# Patient Record
Sex: Female | Born: 1980
Health system: Southern US, Community
[De-identification: ages and names within clinical notes are randomized; demographics above are authoritative.]

---

## 2020-02-05 ENCOUNTER — Encounter (HOSPITAL_COMMUNITY): Payer: Self-pay | Admitting: Emergency Medicine

## 2020-02-05 ENCOUNTER — Emergency Department (HOSPITAL_COMMUNITY)
Admission: EM | Admit: 2020-02-05 | Discharge: 2020-02-06 | Disposition: A | Discharge status: Home / Self Care | Payer: Self-pay | Attending: Emergency Medicine | Admitting: Emergency Medicine

## 2020-02-05 ENCOUNTER — Emergency Department (HOSPITAL_COMMUNITY): Payer: Self-pay

## 2020-02-05 DIAGNOSIS — M79602 Pain in left arm: Secondary | ICD-10-CM | POA: Insufficient documentation

## 2020-02-05 DIAGNOSIS — M542 Cervicalgia: Secondary | ICD-10-CM | POA: Insufficient documentation

## 2020-02-05 DIAGNOSIS — W19XXXA Unspecified fall, initial encounter: Secondary | ICD-10-CM

## 2020-02-05 DIAGNOSIS — M549 Dorsalgia, unspecified: Secondary | ICD-10-CM | POA: Insufficient documentation

## 2020-02-05 DIAGNOSIS — W010XXA Fall on same level from slipping, tripping and stumbling without subsequent striking against object, initial encounter: Secondary | ICD-10-CM | POA: Insufficient documentation

## 2020-02-05 DIAGNOSIS — Y92511 Restaurant or cafe as the place of occurrence of the external cause: Secondary | ICD-10-CM | POA: Insufficient documentation

## 2020-02-05 DIAGNOSIS — M25552 Pain in left hip: Secondary | ICD-10-CM | POA: Insufficient documentation

## 2020-02-05 MED ORDER — IBUPROFEN 400 MG PO TABS: 600.0000 mg | ORAL_TABLET | Freq: Once | ORAL | Status: DC

## 2020-02-05 NOTE — ED Notes (Signed)
Patient transported to CT 

## 2020-02-05 NOTE — ED Triage Notes (Signed)
Pt arrives via [facility] after slipping and falling at work at ground level. EMS states that pt reported a burning pain in her lower lumbar spine and pain in buttocks. EMS noted left-sided deficits during transportation - weaker strength in L hand and weaker push against pressure in L foot. Pt refused peripheral IV and medications during transportation to hospital.

## 2020-02-05 NOTE — ED Notes (Signed)
Patient transported to X-ray 

## 2020-02-05 NOTE — Discharge Instructions (Signed)
You have been seen today for a few areas of injury. There were no acute abnormalities on the x-rays, including no sign of fracture or dislocation, however, there could be injuries to the soft tissues, such as the ligaments or tendons that are not seen on xrays. There could also be what are called occult fractures that are small fractures not seen on xray. Antiinflammatory medications: Take 600 mg of ibuprofen every 6 hours or 440 mg (over the counter dose) to 500 mg (prescription dose) of naproxen every 12 hours for the next 3 days. After this time, these medications may be used as needed for pain. Take these medications with food to avoid upset stomach. Choose only one of these medications, do not take them together. Acetaminophen (generic for Tylenol): Should you continue to have additional pain while taking the ibuprofen or naproxen, you may add in acetaminophen as needed. Your daily total maximum amount of acetaminophen from all sources should be limited to 4000mg /day for persons without liver problems, or 2000mg /day for those with liver problems. Ice: May apply ice to the area over the next 24 hours for 15 minutes at a time to reduce swelling. Elevation: Keep the extremity elevated as often as possible to reduce pain and inflammation. Follow up: If symptoms are improving, you may follow up with your primary care provider for any continued management. If symptoms are not starting to improve within a week, you should follow up with the orthopedic specialist within two weeks. Return: Return to the ED for numbness, weakness, increasing pain, overall worsening symptoms, loss of function, or if symptoms are not improving, you have tried to follow up with the orthopedic specialist, and have been unable to do so.  For prescription assistance, may try using prescription discount sites or apps, such as goodrx.com or Good Rx smart phone app.

## 2020-02-05 NOTE — ED Provider Notes (Signed)
[facility] EMERGENCY DEPARTMENT Provider Note   CSN: [id number] Arrival date & time: 02/05/20  2159     History No chief complaint on file.   Heather Travis is a 39 y.o. female.  HPI      Heather Travis is a 39 y.o. female, patient with no pertinent past medical history, presenting to the ED for evaluation following a fall that occurred shortly prior to arrival. Patient states she was at a restaurant, slipped on some water, and landed on her left side. She complains of pain in the left posterior hip, back, left arm pain, and neck. Denies anticoagulation.  Denies syncope, LOC, nausea/vomiting, numbness, weakness, chest pain, shortness of breath, abdominal pain, head injury, or any other complaints.  History reviewed. No pertinent past medical history.  There are no problems to display for this patient.   History reviewed. No pertinent surgical history.   OB History   No obstetric history on file.     History reviewed. No pertinent family history.  Social History   Tobacco Use  . Smoking status: Not on file  Substance Use Topics  . Alcohol use: Not on file  . Drug use: Not on file    Home Medications Prior to Admission medications   Not on File    Allergies    Patient has no known allergies.  Review of Systems   Review of Systems  Constitutional: Negative for diaphoresis.  Respiratory: Negative for shortness of breath.   Cardiovascular: Negative for chest pain.  Gastrointestinal: Negative for abdominal pain, nausea and vomiting.  Musculoskeletal: Positive for arthralgias, back pain and neck pain.  Neurological: Negative for dizziness, syncope, weakness, numbness and headaches.  All other systems reviewed and are negative.   Physical Exam Updated Vital Signs BP 119/71   Pulse 89   Resp 14   Ht 5\' 4"  (1.626 m)   Wt 72.6 kg   SpO2 100%   BMI 27.46 kg/m   Physical Exam Vitals and nursing note reviewed.  Constitutional:       General: She is not in acute distress.    Appearance: She is well-developed. She is not diaphoretic.     Interventions: Cervical collar in place.  HENT:     Head: Normocephalic and atraumatic.     Mouth/Throat:     Mouth: Mucous membranes are moist.     Pharynx: Oropharynx is clear.  Eyes:     Conjunctiva/sclera: Conjunctivae normal.  Neck:     Comments: After clear cervical spine CT, c-collar was removed.  Patient has full range of motion in the neck without noted difficulty.  No persistent pain. Cardiovascular:     Rate and Rhythm: Normal rate and regular rhythm.     Pulses: Normal pulses.          Radial pulses are 2+ on the right side and 2+ on the left side.       Posterior tibial pulses are 2+ on the right side and 2+ on the left side.     Heart sounds: Normal heart sounds.     Comments: Tactile temperature in the extremities appropriate and equal bilaterally. Pulmonary:     Effort: Pulmonary effort is normal. No respiratory distress.     Breath sounds: Normal breath sounds.  Abdominal:     Palpations: Abdomen is soft.     Tenderness: There is no abdominal tenderness. There is no guarding.  Musculoskeletal:     Cervical back: Neck supple.  Right lower leg: No edema.     Left lower leg: No edema.     Comments: Some tenderness to the cervical musculature flanking the spine.  No deformity, instability, swelling.  Tenderness along the thoracic and lumbar spine, mostly in the left-sided musculature flanking the spine.  No deformity, instability, swelling.  Tenderness to the left posterior hip and buttocks without noted deformity or instability.  No lateral or anterior tenderness.  Patient endorses pain with range of motion of the left arm at the shoulder, however, she does not have any point tenderness in the left shoulder, swelling, or deformity. She does have some tenderness in the posterior distal humeral area without other signs of injury. Full range of motion in the left  shoulder, elbow, and wrist without noted deficit.  No tenderness, pain, swelling, further signs of injury to the rest of the extremities.  Overall trauma exam performed without any abnormalities noted other than those mentioned.  Skin:    General: Skin is warm and dry.  Neurological:     Mental Status: She is alert and oriented to person, place, and time.     Comments: No noted acute cognitive deficit. Sensation grossly intact to light touch in the extremities.   Grip strengths equal bilaterally.  Initially, patient seemed to have some weakness in the left arm, however, when I discussed this with the patient further she states that she has pain in the left upper arm and this makes her hesitate to use the left arm.  When encouraged to give full strength testing, patient was 5/5 and equal bilaterally. Strength 5/5 in all extremities.  Coordination intact.  Cranial nerves III-XII grossly intact.  Handles oral secretions without noted difficulty.  No noted phonation or speech deficit. No facial droop.   After clear imaging studies, patient ambulated steadily and confidently without need for assistance.  Psychiatric:        Mood and Affect: Mood and affect normal.        Speech: Speech normal.        Behavior: Behavior normal.     ED Results / Procedures / Treatments   Labs (all labs ordered are listed, but only abnormal results are displayed) Labs Reviewed - No data to display  EKG None  Radiology DG Thoracic Spine 2 View  Result Date: 02/05/2020 CLINICAL DATA:  Fall EXAM: THORACIC SPINE 2 VIEWS COMPARISON:  None. FINDINGS: Degenerative spurring throughout the thoracic spine. Normal alignment. No fracture or focal bone lesion. IMPRESSION: No acute bony abnormality. Electronically Signed   By: Charlett Nose M.D.   On: 02/05/2020 23:26   DG Lumbar Spine Complete  Result Date: 02/05/2020 CLINICAL DATA:  Fall, pain EXAM: LUMBAR SPINE - COMPLETE 4+ VIEW COMPARISON:  None. FINDINGS:  There is no evidence of lumbar spine fracture. Alignment is normal. Intervertebral disc spaces are maintained. IMPRESSION: Negative. Electronically Signed   By: Charlett Nose M.D.   On: 02/05/2020 23:27   CT Cervical Spine Wo Contrast  Result Date: 02/05/2020 CLINICAL DATA:  39 year old female with neck trauma. EXAM: CT CERVICAL SPINE WITHOUT CONTRAST TECHNIQUE: Multidetector CT imaging of the cervical spine was performed without intravenous contrast. Multiplanar CT image reconstructions were also generated. COMPARISON:  None. FINDINGS: Alignment: No acute subluxation. Skull base and vertebrae: No acute fracture. Soft tissues and spinal canal: No prevertebral fluid or swelling. No visible canal hematoma. Disc levels:  No acute findings.  Mild degenerative changes. Upper chest: Negative. Other: None. IMPRESSION: No acute/traumatic cervical spine pathology.  Electronically Signed   By: Elgie Collard M.D.   On: 02/05/2020 23:12   DG Humerus Left  Result Date: 02/05/2020 CLINICAL DATA:  Fall EXAM: LEFT HUMERUS - 2+ VIEW COMPARISON:  None. FINDINGS: There is no evidence of fracture or other focal bone lesions. Soft tissues are unremarkable. IMPRESSION: Negative. Electronically Signed   By: Charlett Nose M.D.   On: 02/05/2020 23:25   DG Hip Unilat W or Wo Pelvis 2-3 Views Left  Result Date: 02/05/2020 CLINICAL DATA:  Fall, pain EXAM: DG HIP (WITH OR WITHOUT PELVIS) 2-3V LEFT COMPARISON:  None. FINDINGS: There is no evidence of hip fracture or dislocation. There is no evidence of arthropathy or other focal bone abnormality. IMPRESSION: Negative. Electronically Signed   By: Charlett Nose M.D.   On: 02/05/2020 23:25    Procedures Procedures (including critical care time)  Medications Ordered in ED Medications  ibuprofen (ADVIL) tablet 600 mg (has no administration in time range)    ED Course  I have reviewed the triage vital signs and the nursing notes.  Pertinent labs & imaging results that were  available during my care of the patient were reviewed by me and considered in my medical decision making (see chart for details).    MDM Rules/Calculators/A&P                          Patient presents for evaluation following a fall. Patient is nontoxic appearing, not tachycardic, not tachypneic, not hypotensive, excellent SPO2 on room air, and is in no apparent distress.  No focal neurologic deficits on my exam.  I reviewed and interpreted the patient's radiological studies. No acute abnormalities on imaging studies.  When offered analgesia, she stated she would only accept ibuprofen.  She then changed her mind and refused all analgesia.  The patient was given instructions for home care as well as return precautions. Patient voices understanding of these instructions, accepts the plan, and is comfortable with discharge.   Final Clinical Impression(s) / ED Diagnoses Final diagnoses:  Fall, initial encounter    Rx / DC Orders ED Discharge Orders    None       Concepcion Living 02/05/20 2351    Gerhard Munch, MD 02/06/20 2323

## 2020-02-06 NOTE — ED Notes (Signed)
E-signature pad unavailable at time of pt discharge. This RN discussed discharge materials with pt and answered all pt questions. Pt stated understanding of discharge material. ? ?

## 2020-04-10 ENCOUNTER — Ambulatory Visit (INDEPENDENT_AMBULATORY_CARE_PROVIDER_SITE_OTHER): Payer: Self-pay | Admitting: Primary Care

## 2020-04-10 ENCOUNTER — Other Ambulatory Visit: Payer: Self-pay

## 2020-04-10 ENCOUNTER — Encounter (INDEPENDENT_AMBULATORY_CARE_PROVIDER_SITE_OTHER): Payer: Self-pay | Admitting: Primary Care

## 2020-04-10 VITALS — BP 124/77 | HR 78 | Temp 97.3°F | Ht 64.0 in | Wt 165.2 lb

## 2020-04-10 DIAGNOSIS — R300 Dysuria: Secondary | ICD-10-CM

## 2020-04-10 DIAGNOSIS — Z131 Encounter for screening for diabetes mellitus: Secondary | ICD-10-CM

## 2020-04-10 DIAGNOSIS — Z23 Encounter for immunization: Secondary | ICD-10-CM

## 2020-04-10 DIAGNOSIS — Z7689 Persons encountering health services in other specified circumstances: Secondary | ICD-10-CM

## 2020-04-10 DIAGNOSIS — Z09 Encounter for follow-up examination after completed treatment for conditions other than malignant neoplasm: Secondary | ICD-10-CM

## 2020-04-10 LAB — POCT URINALYSIS DIP (CLINITEK)
Bilirubin, UA: NEGATIVE
Glucose, UA: NEGATIVE mg/dL
Ketones, POC UA: NEGATIVE mg/dL
Nitrite, UA: NEGATIVE
POC PROTEIN,UA: NEGATIVE
Spec Grav, UA: 1.01 (ref 1.010–1.025)
Urobilinogen, UA: 0.2 E.U./dL
pH, UA: 6.5 (ref 5.0–8.0)

## 2020-04-10 LAB — POCT GLYCOSYLATED HEMOGLOBIN (HGB A1C): Hemoglobin A1C: 5.4 % (ref 4.0–5.6)

## 2020-04-10 NOTE — Patient Instructions (Signed)
Retencin urinaria aguda en las mujeres Acute Urinary Retention, Female  La retencin urinaria aguda sucede cuando no puede hacer pis (orinar), o cuando orina muy poco y su vejiga no est del todo vaca. Si no se trata, puede desencadenar en dao renal u otros problemas graves. Siga estas indicaciones en su casa:  Tome los medicamentos de venta libre y los recetados solamente como se lo haya indicado el mdico. Pregntele a su mdico qu medicamentos no debe ingerir. No use ningn medicamento excepto que el mdico lo apruebe.  Si volvi a su hogar con un tubo que drena la orina de la vejiga (catter), cudelo tal como se lo haya indicado su mdico.  Beba suficiente lquido para mantener el pis claro o de color amarillo plido.  Si le recetaron un antibitico, tmelo o aplquelo como se lo haya indicado el mdico. No deje de tomar los antibiticos aunque comience a sentirse mejor.  No consuma ningn producto que contenga nicotina o tabaco, como cigarrillos y cigarrillos electrnicos. Si necesita ayuda para dejar de fumar, consulte al mdico.  Controle los cambios en sus sntomas. Dgale a su mdico sobre ellos.  Haga un seguimiento de los cambios en la presin arterial en su hogar, si as se le indic. Dgale a su mdico sobre ellos.  Concurra a todas las visitas de control como se lo haya indicado el mdico. Esto es importante. Comunquese con un mdico si:  Tiene espasmos o pierde orina cuando tiene espasmos. Solicite ayuda de inmediato si:  Tiene escalofros o fiebre.  Observa sangre en la orina.  Tiene un tubo que drena la vejiga, y: ? El tubo no drena la orina. ? El tubo se sale. Resumen  La retencin urinaria aguda sucede cuando no puede orinar, o cuando orina muy poco y su vejiga no est del todo vaca. Si no se trata, puede resultar en dao renal u otros problemas graves.  Si volvi a su hogar con un tubo que drena la orina de la vejiga, cudelo como se lo haya indicado su  mdico.  Est atento a cualquier cambio en los sntomas. Dgale a su mdico sobre ellos. Esta informacin no tiene como fin reemplazar el consejo del mdico. Asegrese de hacerle al mdico cualquier pregunta que tenga. Document Revised: 02/12/2017 Document Reviewed: 10/01/2016 Elsevier Patient Education  2020 Elsevier Inc.  

## 2020-04-10 NOTE — Progress Notes (Signed)
°  HPI Ms. Heather Travis is a 39 y.o.Hispanic interpretor used Heather Travis [id number]) female presents for follow up from 2 separate visit  hospital  ED was 02/05/20, s/p fall patient was discharged l on 02/06/20,Follow up with Heather Frederic, MD (Orthopedic Surgery); As needed. Patient presented to Urgent care on 03/03/20 for Increase urination frequency, burning and pain maybe signs of infection. A urinalysis will be done in the case a urinary tract infection is present .The urine will be sent for culture and sensitivity. She had the same antibiotics at home a year old therefore she did not pick new prescription up. Patient is also establishing care    patient was admitted for:   No past medical history on file.   No Known Allergies    No current outpatient medications on file prior to visit.   No current facility-administered medications on file prior to visit.    ROS: all negative except above.   Physical Exam: Filed Weights   04/10/20 0904  Weight: 165 lb 3.2 oz (74.9 kg)   BP 124/77 (BP Location: Right Arm, Patient Position: Sitting, Cuff Size: Normal)    Pulse 78    Temp (!) 97.3 F (36.3 C) (Temporal)    Ht 5\' 4"  (1.626 m)    Wt 165 lb 3.2 oz (74.9 kg)    LMP 03/11/2020 (Approximate)    BMI 28.36 kg/m  General Appearance: Well nourished, in no apparent distress. Eyes: PERRLA, EOMs, conjunctiva no swelling or erythema Sinuses: No Frontal/maxillary tenderness ENT/Mouth: Ext aud canals clear, TMs without erythema, bulging.  Hearing normal.  Neck: Supple, thyroid normal.  Respiratory: Respiratory effort normal, BS equal bilaterally without rales, rhonchi, wheezing or stridor.  Cardio: RRR with no MRGs. Brisk peripheral pulses without edema.  Abdomen: Soft, + BS.  Non tender, no guarding, rebound, hernias, masses. Lymphatics: Non tender without lymphadenopathy.  Musculoskeletal: Full ROM, 5/5 strength, normal gait.  Skin: Warm, dry without rashes, lesions, ecchymosis.  Neuro: Cranial nerves  intact. Normal muscle tone, no cerebellar symptoms. Sensation intact.  Psych: Awake and oriented X 3, normal affect, Insight and Judgment appropriate.   Heather Travis was seen today for new patient (initial visit).  Diagnoses and all orders for this visit:  Need for Tdap vaccination -     Tdap vaccine greater than or equal to 7yo IM  Screening for diabetes mellitus -     HgB A1c 5.4 not per ADA guidelines diabetic   Dysuria DX UTI ++ leucocytes  -     POCT URINALYSIS DIP (CLINITEK) -     Urine Culture  Hospital discharge follow-up See HPI   Encounter to establish care Establish care with New PCP [place] lived in [place]   [place], NP 9:11 AM

## 2020-04-10 NOTE — Progress Notes (Signed)
Pt had a cyst on urethra. Believes she passed the cyst. Every time she gets menstrual she develops UTI

## 2020-04-14 LAB — URINE CULTURE

## 2020-04-18 ENCOUNTER — Other Ambulatory Visit (INDEPENDENT_AMBULATORY_CARE_PROVIDER_SITE_OTHER): Payer: Self-pay | Admitting: Primary Care

## 2020-04-18 MED ORDER — NITROFURANTOIN MONOHYD MACRO 100 MG PO CAPS: 100.0000 mg | ORAL_CAPSULE | Freq: Two times a day (BID) | ORAL | 0 refills | Status: DC

## 2020-04-19 ENCOUNTER — Other Ambulatory Visit: Payer: Self-pay | Admitting: Primary Care

## 2020-04-19 ENCOUNTER — Telehealth: Payer: Self-pay

## 2020-04-19 ENCOUNTER — Other Ambulatory Visit: Payer: Self-pay

## 2020-04-19 MED ORDER — NITROFURANTOIN MONOHYD MACRO 100 MG PO CAPS
100.0000 mg | ORAL_CAPSULE | Freq: Two times a day (BID) | ORAL | 0 refills | Status: DC
Start: 2020-04-19 — End: 2020-04-19

## 2020-04-19 MED FILL — NITROFURANTOIN MONO-MCR 100: 100 | 5 days supply | Qty: 10 | Fill #0

## 2020-04-19 NOTE — Telephone Encounter (Signed)
Spoke with patient. She verified date of birth. She is aware of UTI. Due to no pharmacy being listed medication for treatment sent to [facility]. Provided patient with address to clinic pharmacy. She verbalized understanding. Maryjean Morn, CMA

## 2020-04-19 NOTE — Telephone Encounter (Signed)
-----   Message from Grayce Sessions, NP sent at 04/18/2020 11:45 PM EST ----- Patient has a UTI no pharmacy listed sent abt to [facility]

## 2020-05-03 ENCOUNTER — Telehealth (INDEPENDENT_AMBULATORY_CARE_PROVIDER_SITE_OTHER): Payer: Self-pay | Admitting: Primary Care

## 2020-05-03 NOTE — Telephone Encounter (Signed)
Pt is calling through spanish interpreter marcella ID number [id number]. Pt was prescribed macrobid 100 mg for uti and she thinks she is resistant to abx and would like a different abx sent to [facility] pharmacy. Pt is still having burning when urinating and foul urine smell. Please advise

## 2020-05-04 NOTE — Telephone Encounter (Signed)
Please advise 

## 2020-05-05 ENCOUNTER — Ambulatory Visit (INDEPENDENT_AMBULATORY_CARE_PROVIDER_SITE_OTHER): Payer: Self-pay

## 2020-05-05 ENCOUNTER — Other Ambulatory Visit: Payer: Self-pay

## 2020-05-05 DIAGNOSIS — R300 Dysuria: Secondary | ICD-10-CM

## 2020-05-05 NOTE — Telephone Encounter (Signed)
Called patient to schedule an appointment for a urine screening prior to being able to receive any other antibiotics. LVM using interpreter services to call back [phone] to schedule.

## 2020-05-10 LAB — URINE CULTURE

## 2020-05-15 ENCOUNTER — Other Ambulatory Visit (INDEPENDENT_AMBULATORY_CARE_PROVIDER_SITE_OTHER): Payer: Self-pay | Admitting: Primary Care

## 2020-05-15 DIAGNOSIS — N390 Urinary tract infection, site not specified: Secondary | ICD-10-CM

## 2020-05-15 MED ORDER — AMOXICILLIN-POT CLAVULANATE 875-125 MG PO TABS: 1.0000 | ORAL_TABLET | Freq: Two times a day (BID) | ORAL | 0 refills | Status: DC

## 2020-05-16 ENCOUNTER — Telehealth (INDEPENDENT_AMBULATORY_CARE_PROVIDER_SITE_OTHER): Payer: Self-pay

## 2020-05-16 MED FILL — AMOX-CLAV 875-125 MG TABLET: 875-125 | 10 days supply | Qty: 20 | Fill #0

## 2020-05-16 NOTE — Telephone Encounter (Signed)
-----   Message from Grayce Sessions, NP sent at 05/15/2020 11:28 PM EST ----- This is a second UTI since December E. coli will treat with amoxicillin clavulanate.  Need to ask what direction she wipes after using the bathroom.  PCP for follow-up of other questions need to be addressed

## 2020-05-16 NOTE — Telephone Encounter (Signed)
Patient is aware of UTI and antibiotic being sent to pharmacy. Heather Travis, CMA

## 2020-05-23 MED FILL — AMOX-CLAV 875-125 MG TABLET: 875-125 | 10 days supply | Qty: 20 | Fill #0

## 2021-02-26 IMAGING — CT CT CERVICAL SPINE W/O CM
4 series · 16 of 33 positions shown, 19 images · non-contrast
Comparison: None.

CLINICAL DATA: 38-year-old female with neck trauma.

EXAM:
CT CERVICAL SPINE WITHOUT CONTRAST
TECHNIQUE: Multidetector CT imaging of the cervical spine was performed without
intravenous contrast. Multiplanar CT image reconstructions were also
generated.

[Series 5: c spine soft · axial · 0.35mm/px · z∈[-366,-298]mm · 3 of 104 slices shown]
[im 18/104  soft-tissue]
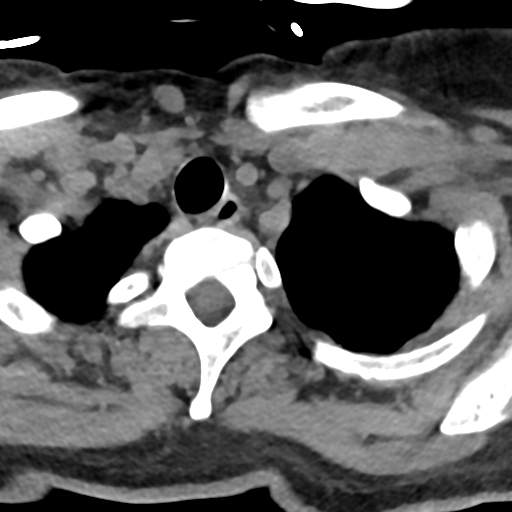
[im 35/104  soft-tissue]
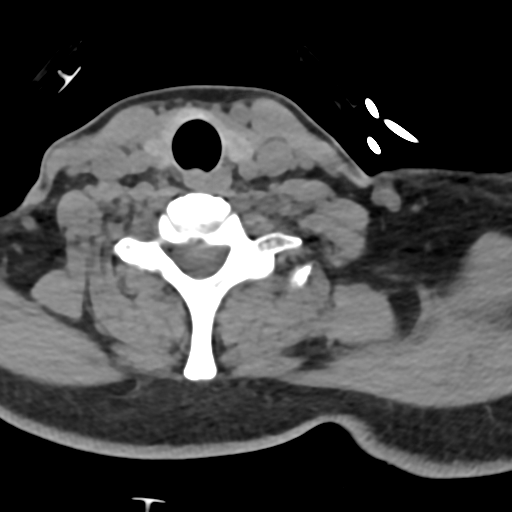
[im 52/104  soft-tissue]
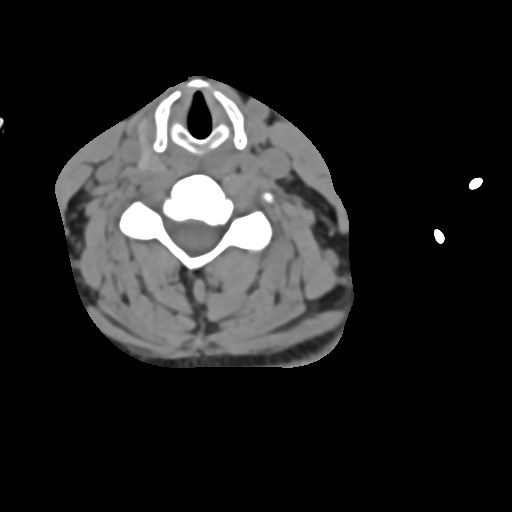

[Series 8: sag bone · sagittal · 0.43mm/px · 5 of 71 slices shown, 6 images]
[im 24/71  bone]
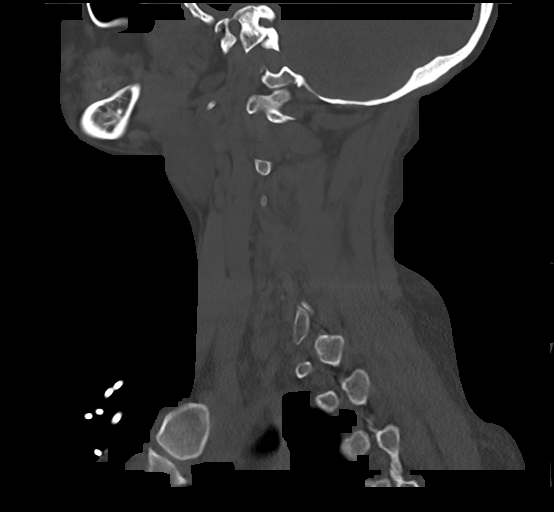
[im 30/71  bone]
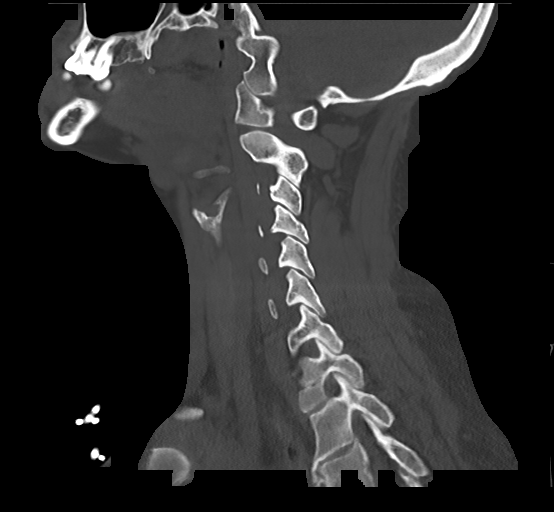
[im 36/71  soft-tissue]
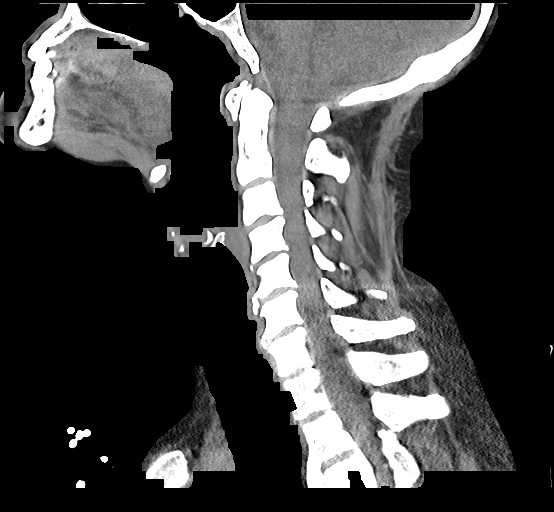
[im 36/71  bone]
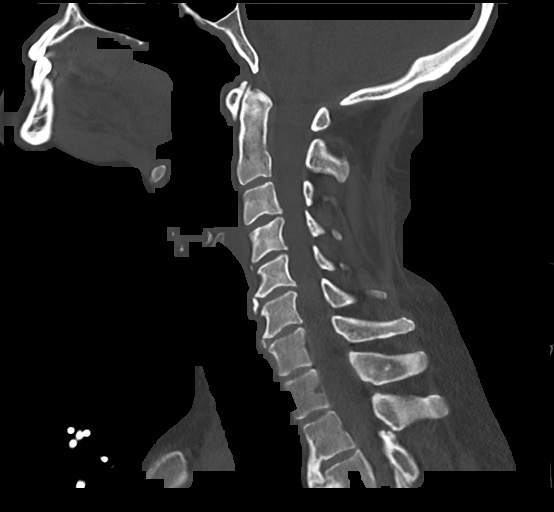
[im 41/71  bone]
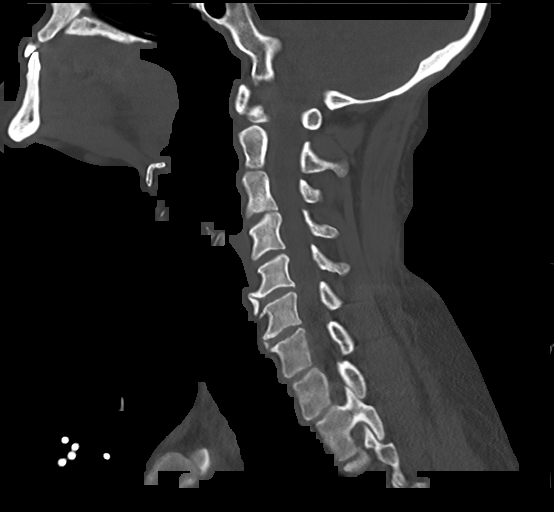
[im 47/71  bone]
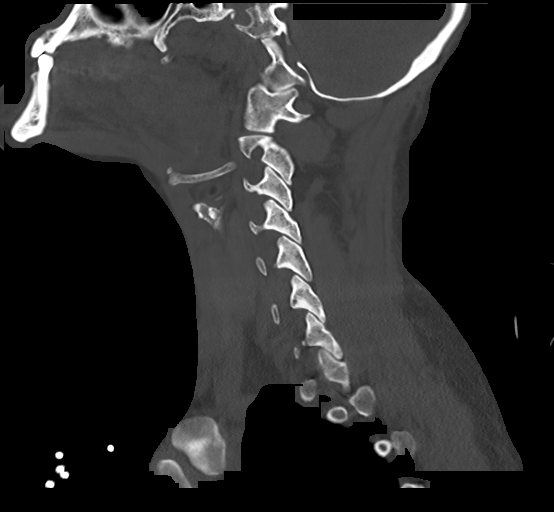

[Series 9: cor bone · coronal · 0.37mm/px · 3 of 78 slices shown]
[im 16/78  bone]
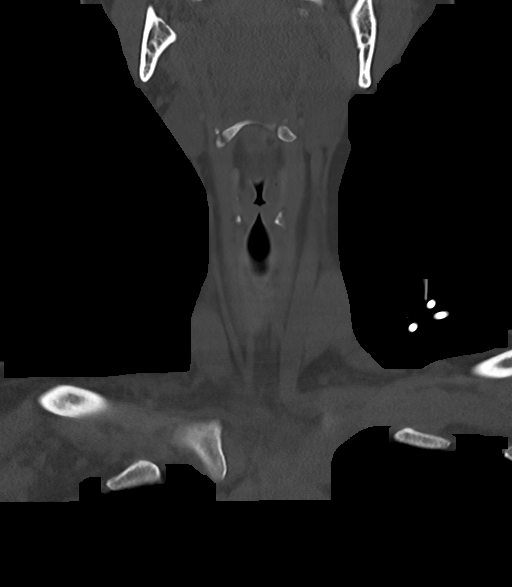
[im 31/78  bone]
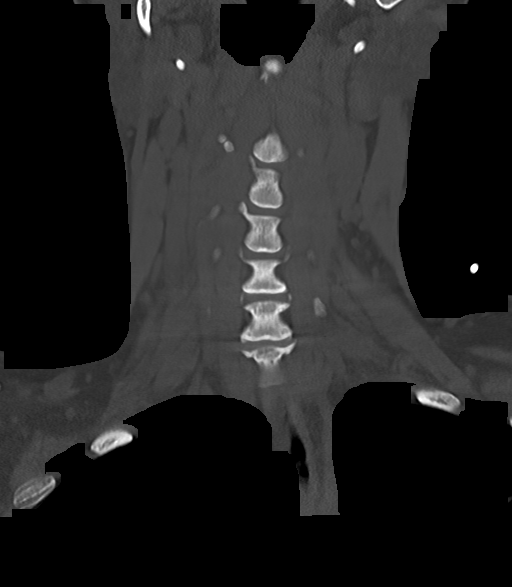
[im 47/78  bone]
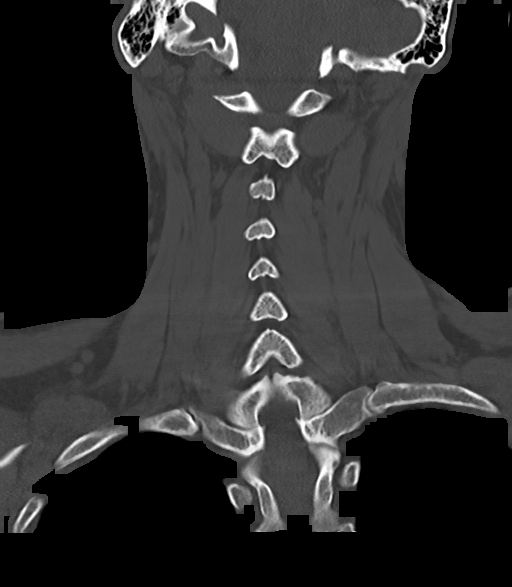

[Series 10: orthogonal axials · axial · 0.21mm/px · z∈[-387,-250]mm · 5 of 99 slices shown, 7 images]
[im 17/99  soft-tissue]
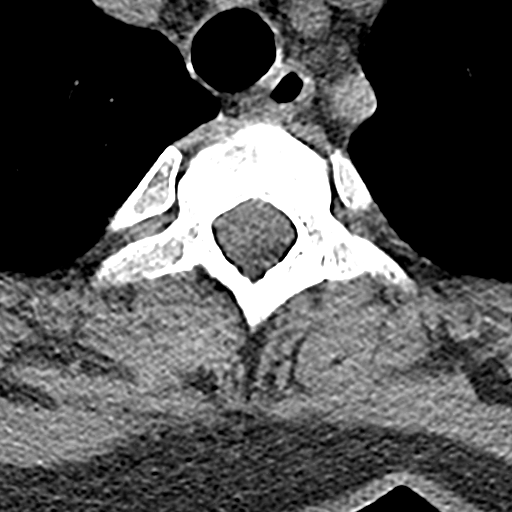
[im 17/99  bone]
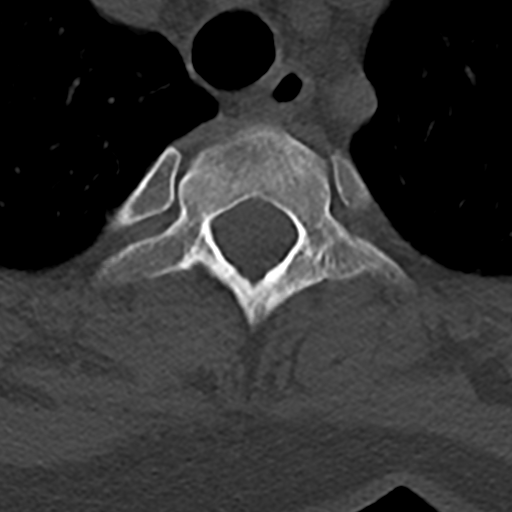
[im 33/99  bone]
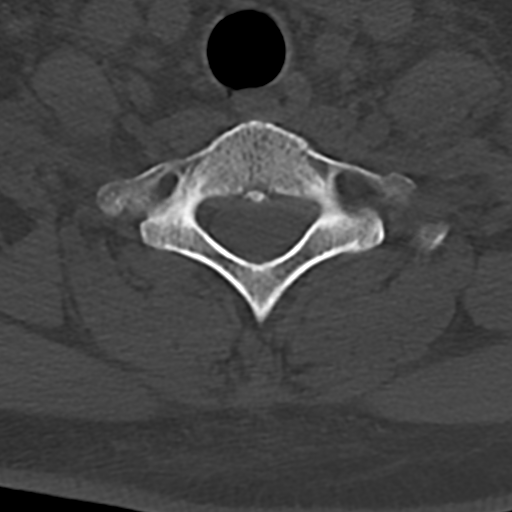
[im 50/99  bone]
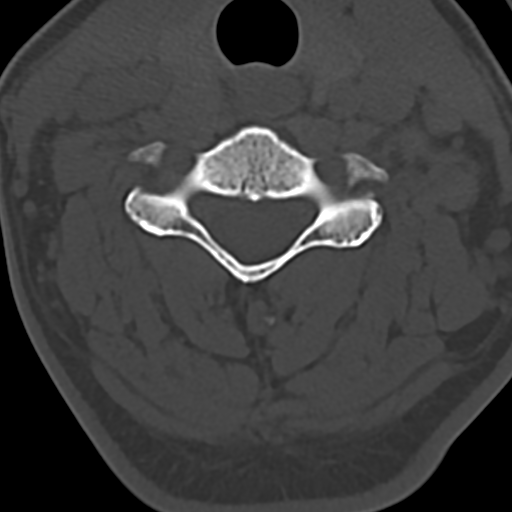
[im 66/99  bone]
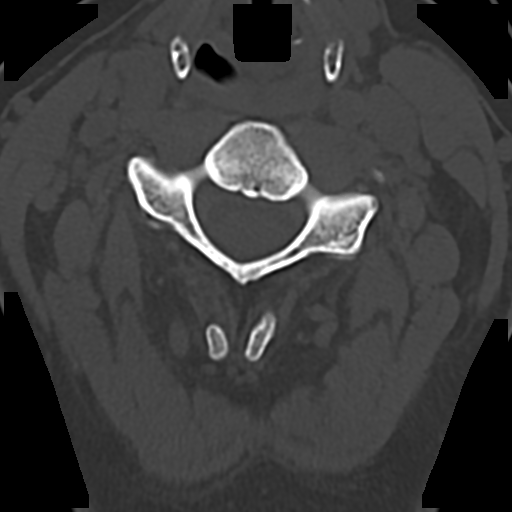
[im 82/99  soft-tissue]
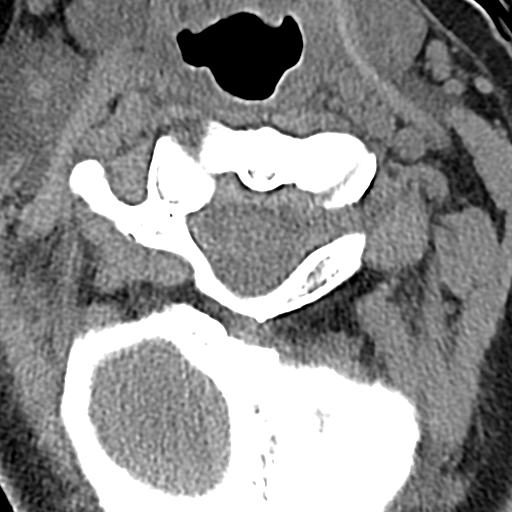
[im 82/99  bone]
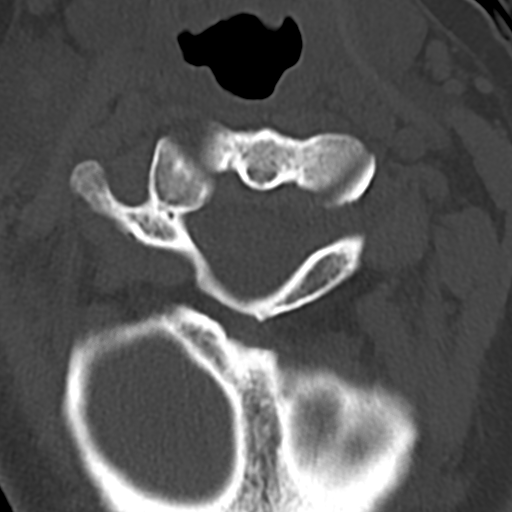

[16 of 33 positions shown; findings below may reference images not displayed]

FINDINGS: Alignment: No acute subluxation.

Skull base and vertebrae: No acute fracture.

Soft tissues and spinal canal: No prevertebral fluid or swelling. No
visible canal hematoma.

Disc levels:  No acute findings.  Mild degenerative changes.

Upper chest: Negative.

Other: None.
IMPRESSION: No acute/traumatic cervical spine pathology.

## 2021-02-26 IMAGING — DX DG THORACIC SPINE 2V
3 series · 3 of 3 positions shown · non-contrast
Comparison: None.

CLINICAL DATA: Fall

EXAM:
THORACIC SPINE 2 VIEWS

[t-spine ap]
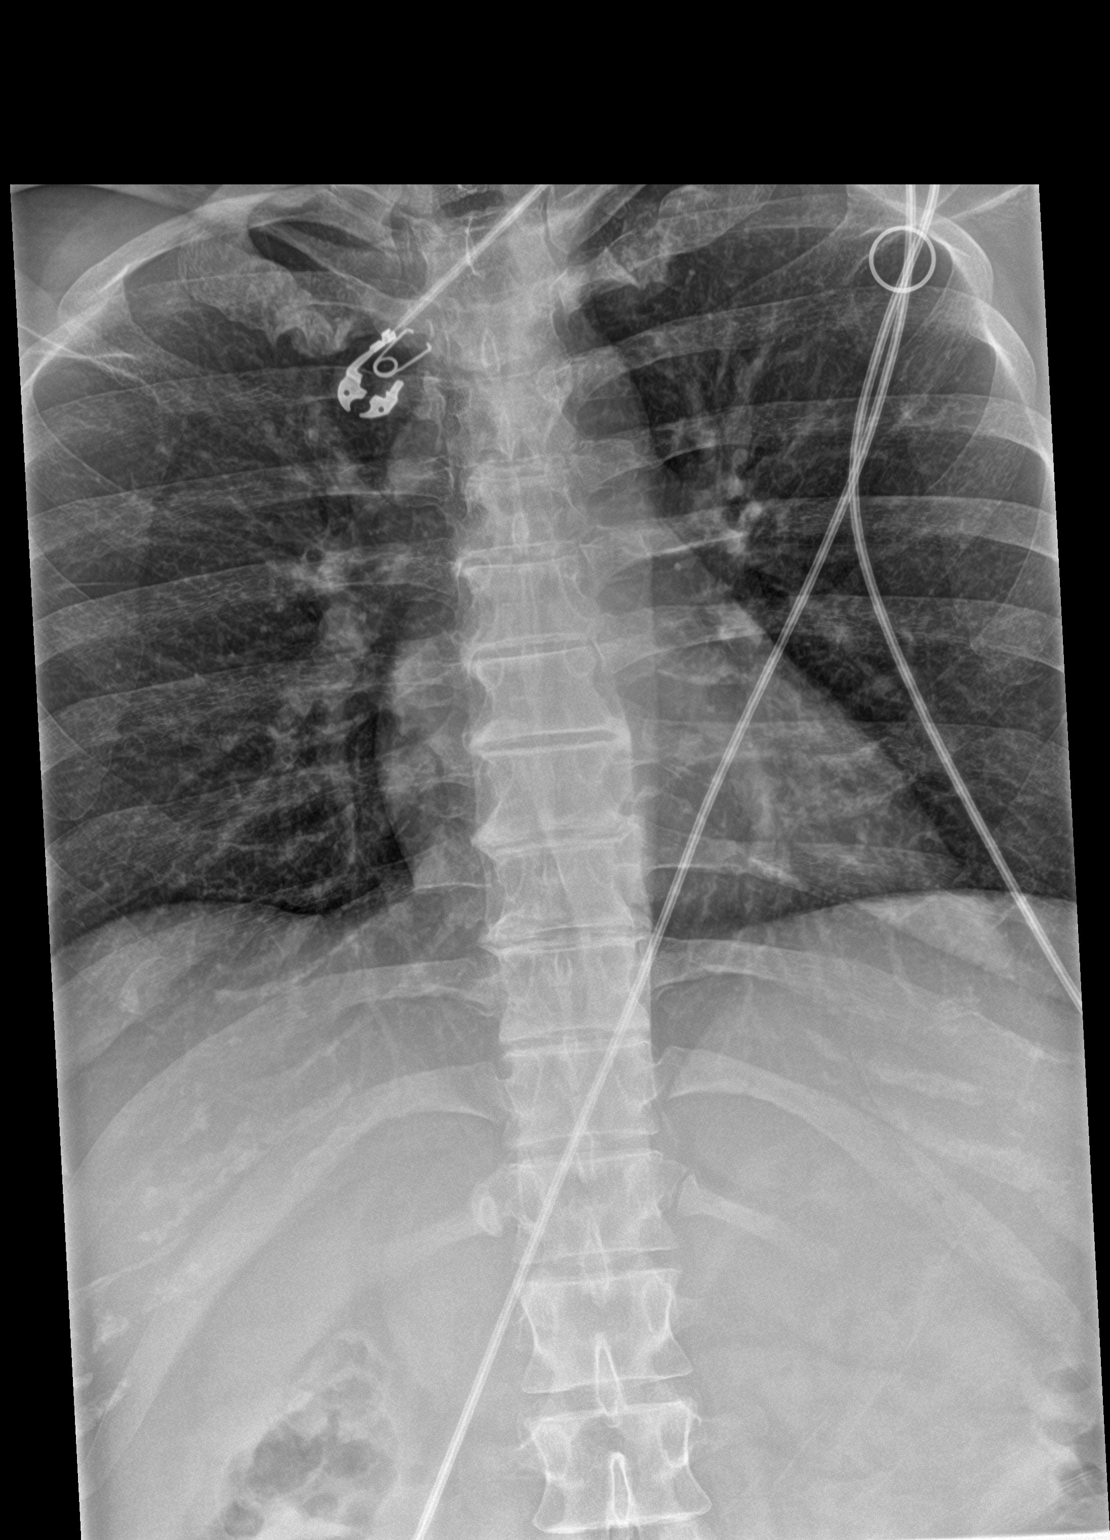

[t-spine lat]
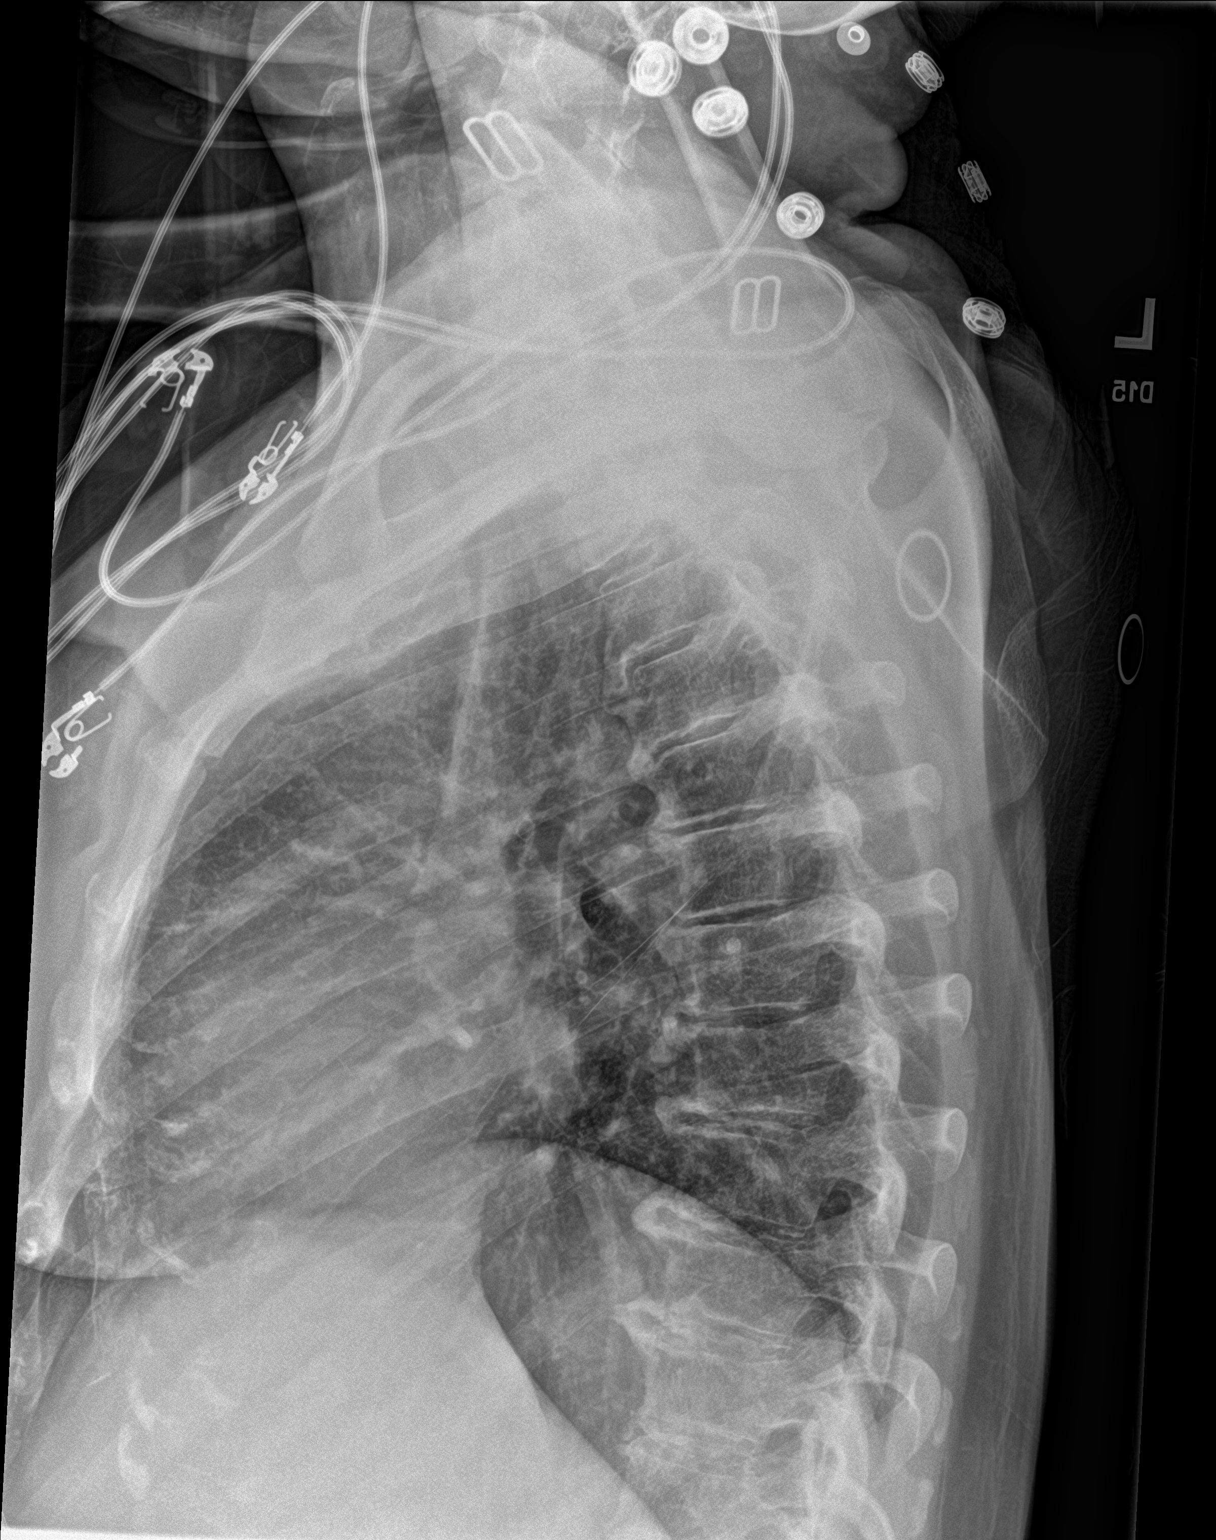

[t-spine swimmers]
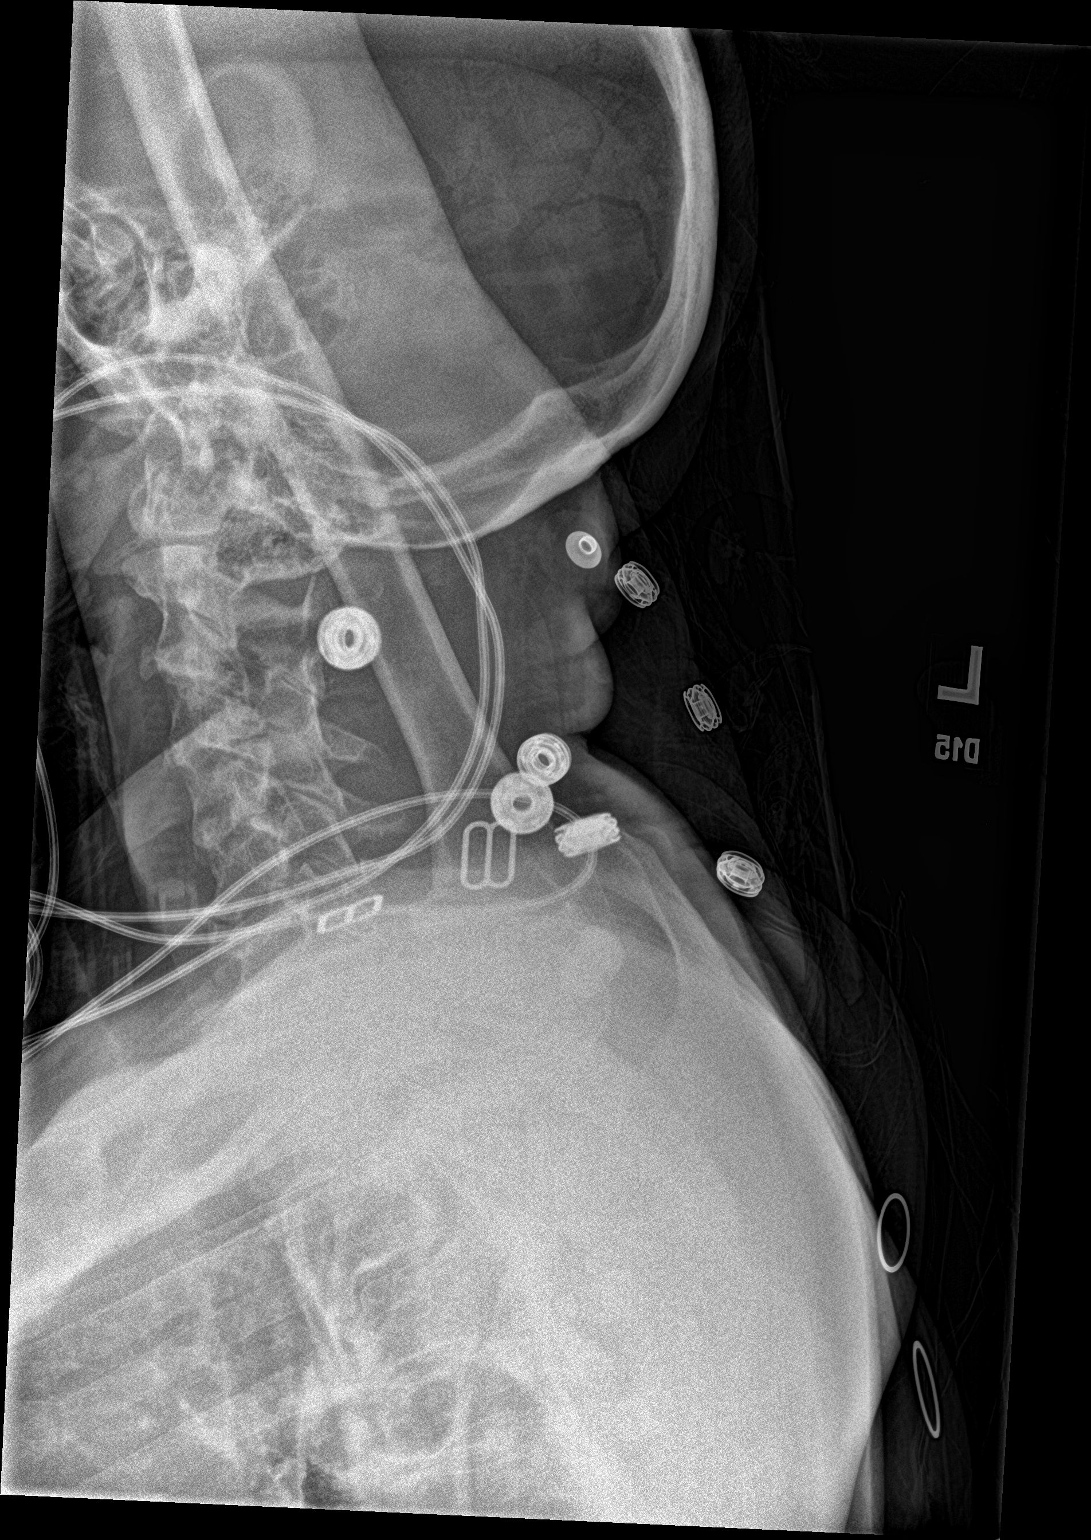

[3 of 3 positions shown; findings below may reference images not displayed]

FINDINGS: Degenerative spurring throughout the thoracic spine. Normal
alignment. No fracture or focal bone lesion.
IMPRESSION: No acute bony abnormality.

## 2023-01-29 ENCOUNTER — Emergency Department (HOSPITAL_COMMUNITY)
Admission: EM | Admit: 2023-01-29 | Discharge: 2023-01-30 | Disposition: A | Discharge status: Home / Self Care | Payer: BC Managed Care – PPO | Attending: Emergency Medicine | Admitting: Emergency Medicine

## 2023-01-29 ENCOUNTER — Other Ambulatory Visit: Payer: Self-pay

## 2023-01-29 ENCOUNTER — Encounter (HOSPITAL_COMMUNITY): Payer: Self-pay

## 2023-01-29 ENCOUNTER — Emergency Department (HOSPITAL_COMMUNITY): Payer: BC Managed Care – PPO

## 2023-01-29 DIAGNOSIS — S6991XA Unspecified injury of right wrist, hand and finger(s), initial encounter: Secondary | ICD-10-CM | POA: Diagnosis present

## 2023-01-29 DIAGNOSIS — W010XXA Fall on same level from slipping, tripping and stumbling without subsequent striking against object, initial encounter: Secondary | ICD-10-CM | POA: Diagnosis not present

## 2023-01-29 DIAGNOSIS — W19XXXA Unspecified fall, initial encounter: Secondary | ICD-10-CM

## 2023-01-29 DIAGNOSIS — M7989 Other specified soft tissue disorders: Secondary | ICD-10-CM | POA: Diagnosis not present

## 2023-01-29 DIAGNOSIS — S60221A Contusion of right hand, initial encounter: Secondary | ICD-10-CM | POA: Diagnosis not present

## 2023-01-29 NOTE — ED Triage Notes (Signed)
Pt states she fell and hurt right hand, pt is able to move hand and squeeze hand closed. Sensation and pulses intact.

## 2023-01-30 MED ORDER — IBUPROFEN 200 MG PO TABS
600.0000 mg | ORAL_TABLET | Freq: Once | ORAL | Status: DC
  Filled 2023-01-30: qty 3

## 2023-01-30 MED ORDER — ACETAMINOPHEN 325 MG PO TABS
650.0000 mg | ORAL_TABLET | Freq: Once | ORAL | Status: DC
  Filled 2023-01-30: qty 2

## 2023-01-30 NOTE — ED Notes (Signed)
Ortho called for thumb spica  

## 2023-01-30 NOTE — ED Provider Notes (Signed)
[place] [facility] AT [facility] Provider Note   CSN: [id number] Arrival date & time: 01/29/23  2236     History Chief Complaint  Patient presents with   Hand Injury    Heather Travis is a 42 y.o. female self reportedly otherwise healthy presents emerged from today for evaluation of right hand pain.  Patient reports that yesterday she slipped and fell landing on her right hand.  She is having pain with more pinching motions.  No pain into the wrist or other parts of her arm.  She denies any other injury.  Denies hitting her head.  No blood thinner use.  She denies any numbness or tingling into the hand.  She is right-hand dominant.  No medications taken prior to arrival.   Hand Injury      Home Medications Prior to Admission medications   Not on File      Allergies    Patient has no known allergies.    Review of Systems   Review of Systems  Musculoskeletal:  Positive for arthralgias.    Physical Exam Updated Vital Signs BP 110/75   Pulse 66   Temp 98.5 F (36.9 C) (Oral)   Resp 16   Ht 5\' 4"  (1.626 m)   Wt 77.1 kg   LMP 01/29/2023   SpO2 100%   BMI 29.18 kg/m  Physical Exam Vitals and nursing note reviewed.  Constitutional:      General: She is not in acute distress.    Appearance: She is not ill-appearing or toxic-appearing.  Eyes:     General: No scleral icterus. Pulmonary:     Effort: Pulmonary effort is normal. No respiratory distress.  Musculoskeletal:        General: Tenderness present.     Comments: Right hand - tendnerness and bruising noted to the more distal aspect of the thenar eminence. Does have some pain with AB and adduction of the thumb.  Finger thumb opposition intact with and without resistant with all fingers.  Palpable radial pulses.  Brisk cap refill present in all 5 finger.  Patient is able to move all fingers fully.  No pain with flexion extension of the wrist.  No snuffbox tenderness.  Compartments are soft.   Sensation is reportedly intact and symmetric bilaterally.  No other tenderness throughout the arm.  Patient has full range of motion of the wrist, elbow, and shoulder without pain.  No other signs of trauma.  There is no lacerations or open abrasions.  Skin:    General: Skin is warm and dry.  Neurological:     General: No focal deficit present.     Mental Status: She is alert.  Psychiatric:        Mood and Affect: Mood normal.     ED Results / Procedures / Treatments   Labs (all labs ordered are listed, but only abnormal results are displayed) Labs Reviewed - No data to display  EKG None  Radiology DG Hand Complete Right  Result Date: 01/29/2023 CLINICAL DATA:  Hand pain.  Recent fall. EXAM: RIGHT HAND - COMPLETE 3+ VIEW COMPARISON:  None Available. FINDINGS: No acute fracture or dislocation. There is a remote healed fifth metacarpal fracture with posttraumatic deformity. Alignment and joint spaces of the hand are preserved. There is ulna positive variance at the wrist. No focal soft tissue abnormalities. IMPRESSION: 1. No acute fracture or dislocation of the right hand. 2. Remote healed fifth metacarpal fracture. Electronically Signed   By: Shawna Orleans  Sanford M.D.   On: 01/29/2023 23:57    Procedures Procedures   Medications Ordered in ED Medications - No data to display  ED Course/ Medical Decision Making/ A&P   Medical Decision Making Amount and/or Complexity of Data Reviewed Radiology: ordered.  Risk OTC drugs.   42 y.o. female presents to the ER for evaluation of hand pain sp fall . Differential diagnosis includes but is not limited to sprain, strain, contusion, fracture, dislocation, tendon/ ligament injury. Vital signs unremarkable. Physical exam as noted above.   XR imaging 1. No acute fracture or dislocation of the right hand. 2. Remote healed fifth metacarpal fracture.  Patient does not have any snuffbox tenderness but does have some bruising to the thenar  eminence.  I do think she would benefit from a thumb spica splint.  Will have her follow-up with hand surgery.  Recommended Tylenol ibuprofen for pain.  She does not have any significant swelling and she is neuro vastly intact distally.  She stable for discharge home with outpatient follow-up as needed.  We discussed the results of the labs/imaging. The plan is follow-up hand surgery as needed, wear splint, Tylenol or Profen as needed. We discussed strict return precautions and red flag symptoms. The patient verbalized their understanding and agrees to the plan. The patient is stable and being discharged home in good condition.  Portions of this report may have been transcribed using voice recognition software. Every effort was made to ensure accuracy; however, inadvertent computerized transcription errors may be present.   Final Clinical Impression(s) / ED Diagnoses Final diagnoses:  Contusion of right hand, initial encounter  Fall, initial encounter    Rx / DC Orders ED Discharge Orders     None         Achille Rich, PA-C 01/30/23 1808    Laurence Spates, MD 02/01/23 0700

## 2023-01-30 NOTE — Discharge Instructions (Addendum)
You were seen in the ER today for evaluation of your hand pain.  Your x-ray does not show any acute fracture.  I am placing you in a thumb spica splint for you to follow-up with hand surgery.  I included information for Dr. Merlyn Lot into the discharge report.  Please ensure you call to schedule an appointment.  In the meantime, for pain you can take Tylenol 1000 mg and ibuprofen 600 mg every 6 hours as needed for pain.  I have included more information about this into the discharge paperwork.  Please review.  If you have any concerns, new or worsening symptoms, please return to your nearest emergency department for evaluation.  Contact a health care provider if: Your symptoms do not get better after several days of treatment. Your hand or fingers have more: Redness. Swelling. Pain. You have trouble moving the injured area. Medicine does not help your swelling or pain. Get help right away if: You have very bad pain. Your hand or fingers are numb. Your hand or fingers turn: Very light (pale). Blue. Cold. You cannot move your hand or wrist. Your hand feels warm when you touch it.

## 2023-01-30 NOTE — Progress Notes (Signed)
Orthopedic Tech Progress Note Patient Details:  Annika Selke [birth year] [id number]  Ortho Devices Type of Ortho Device: Thumb velcro splint Ortho Device/Splint Location: right Ortho Device/Splint Interventions: Ordered, Application, Adjustment   Post Interventions Patient Tolerated: Well Instructions Provided: Adjustment of device, Care of device  Kizzie Fantasia 01/30/2023, 9:23 AM

## 2023-07-21 ENCOUNTER — Ambulatory Visit
Admission: EM | Admit: 2023-07-21 | Discharge: 2023-07-21 | Disposition: A | Discharge status: Home / Self Care | Attending: Family Medicine | Admitting: Family Medicine

## 2023-07-21 ENCOUNTER — Encounter: Payer: Self-pay | Admitting: Emergency Medicine

## 2023-07-21 ENCOUNTER — Other Ambulatory Visit: Payer: Self-pay

## 2023-07-21 DIAGNOSIS — L2989 Other pruritus: Secondary | ICD-10-CM

## 2023-07-21 MED ORDER — TRIAMCINOLONE ACETONIDE 0.1 % EX CREA: 1.0000 | TOPICAL_CREAM | Freq: Two times a day (BID) | CUTANEOUS | 0 refills | Status: AC

## 2023-07-21 NOTE — Discharge Instructions (Addendum)
 Start triamcinolone topically twice daily for 7 days to the breast as needed for itching. Please follow up with your PCP at your scheduled appointment for further workup of your fatigue as well as your breast itching. Please also follow up with your gynecologist for recheck of your symptoms as well. Please go to the ER for any worsening symptoms.

## 2023-07-21 NOTE — ED Triage Notes (Signed)
 Pt c/o fatigue and left breast itching. She states she has already went to GYN and had blood work and everything was normal.

## 2023-07-21 NOTE — ED Provider Notes (Signed)
 [facility]    CSN: [id number] Arrival date & time: 07/21/23  1024      History   Chief Complaint No chief complaint on file.   HPI Heather Travis is a 43 y.o. female presents for fatigue and breast itching.  Patient reports 8 months of fatigue as well as itching around her left nipple as well as sometimes around her right.  States sometimes she can see rash and sometimes she does not.  Denies any swelling, nipple drainage or inversion.  No history of eczema or psoriasis.  Reports a negative mammogram.  States she saw her gynecologist 4 months ago for same symptoms and had blood work and exam and was told everything is fine.  She does not currently have a PCP.  She tried changing her bras without improvement.  She has been using a vitamin EE oil to the area without change.  No other concerns at this time.  HPI  History reviewed. No pertinent past medical history.  There are no active problems to display for this patient.   History reviewed. No pertinent surgical history.  OB History   No obstetric history on file.      Home Medications    Prior to Admission medications   Medication Sig Start Date End Date Taking? Authorizing Provider  triamcinolone cream (KENALOG) 0.1 % Apply 1 Application topically 2 (two) times daily. 07/21/23  Yes Radford Pax, NP    Family History History reviewed. No pertinent family history.  Social History Social History   Tobacco Use   Smoking status: Never   Smokeless tobacco: Never  Substance Use Topics   Alcohol use: Not Currently   Drug use: Never     Allergies   Patient has no known allergies.   Review of Systems Review of Systems  Constitutional:  Positive for fatigue.  Skin:        Breast itching      Physical Exam Triage Vital Signs ED Triage Vitals  Encounter Vitals Group     BP 07/21/23 1037 114/78     Systolic BP Percentile --      Diastolic BP Percentile --      Pulse Rate 07/21/23 1037 78     Resp  07/21/23 1037 15     Temp 07/21/23 1037 98.8 F (37.1 C)     Temp Source 07/21/23 1037 Oral     SpO2 07/21/23 1037 97 %     Weight --      Height --      Head Circumference --      Peak Flow --      Pain Score 07/21/23 1036 0     Pain Loc --      Pain Education --      Exclude from Growth Chart --    No data found.  Updated Vital Signs BP 114/78 (BP Location: Left Arm)   Pulse 78   Temp 98.8 F (37.1 C) (Oral)   Resp 15   LMP 07/07/2023 (Approximate)   SpO2 97%   Visual Acuity Right Eye Distance:   Left Eye Distance:   Bilateral Distance:    Right Eye Near:   Left Eye Near:    Bilateral Near:     Physical Exam Vitals and nursing note reviewed.  Constitutional:      General: She is not in acute distress.    Appearance: Normal appearance. She is not ill-appearing.  HENT:     Head: Normocephalic and atraumatic.  Eyes:     Pupils: Pupils are equal, round, and reactive to light.  Cardiovascular:     Rate and Rhythm: Normal rate.  Pulmonary:     Effort: Pulmonary effort is normal.  Chest:  Breasts:    Right: No swelling, bleeding, inverted nipple, mass, nipple discharge, skin change or tenderness.     Left: No swelling, bleeding, inverted nipple, mass, nipple discharge, skin change or tenderness.     Comments: There is no erythema or warmth of the breast. Lymphadenopathy:     Upper Body:     Right upper body: No axillary adenopathy.     Left upper body: No axillary adenopathy.  Skin:    General: Skin is warm and dry.  Neurological:     General: No focal deficit present.     Mental Status: She is alert and oriented to person, place, and time.  Psychiatric:        Mood and Affect: Mood normal.        Behavior: Behavior normal.      UC Treatments / Results  Labs (all labs ordered are listed, but only abnormal results are displayed) Labs Reviewed - No data to display  EKG   Radiology No results found.  Procedures Procedures (including critical  care time)  Medications Ordered in UC Medications - No data to display  Initial Impression / Assessment and Plan / UC Course  I have reviewed the triage vital signs and the nursing notes.  Pertinent labs & imaging results that were available during my care of the patient were reviewed by me and considered in my medical decision making (see chart for details).     Reviewed exam and symptoms with patient.  Discussed multiple potential causes of fatigue and will refer her to PCP for further workup of this, nursing staff was able to set her up with a PCP appointment.  There is no visible rashes of the breast on exam.  No signs of Paget's disease.  She reports a normal mammogram not too long ago.  Will do trial of triamcinolone topically for symptom relief.  Advised she may need to be referred to breast specialist when she sees her PCP and is evaluated.  Also advised follow-up with gynecology as well as they initially started her workup.  ER precautions reviewed and patient verbalized understanding. Final Clinical Impressions(s) / UC Diagnoses   Final diagnoses:  Pruritus of nipple     Discharge Instructions      Start triamcinolone topically twice daily for 7 days to the breast as needed for itching. Please follow up with your PCP at your scheduled appointment for further workup of your fatigue as well as your breast itching. Please also follow up with your gynecologist for recheck of your symptoms as well. Please go to the ER for any worsening symptoms.     ED Prescriptions     Medication Sig Dispense Auth. Provider   triamcinolone cream (KENALOG) 0.1 % Apply 1 Application topically 2 (two) times daily. 30 g Radford Pax, NP      PDMP not reviewed this encounter.   Radford Pax, NP 07/21/23 [phone]

## 2023-07-24 ENCOUNTER — Ambulatory Visit: Admitting: Urgent Care

## 2023-07-24 ENCOUNTER — Encounter: Payer: Self-pay | Admitting: Urgent Care

## 2023-07-24 VITALS — BP 114/74 | HR 84 | Ht 65.0 in | Wt 177.8 lb

## 2023-07-24 DIAGNOSIS — Z3202 Encounter for pregnancy test, result negative: Secondary | ICD-10-CM | POA: Diagnosis not present

## 2023-07-24 DIAGNOSIS — N926 Irregular menstruation, unspecified: Secondary | ICD-10-CM | POA: Diagnosis not present

## 2023-07-24 DIAGNOSIS — L309 Dermatitis, unspecified: Secondary | ICD-10-CM | POA: Diagnosis not present

## 2023-07-24 DIAGNOSIS — Z319 Encounter for procreative management, unspecified: Secondary | ICD-10-CM

## 2023-07-24 DIAGNOSIS — Z1231 Encounter for screening mammogram for malignant neoplasm of breast: Secondary | ICD-10-CM

## 2023-07-24 LAB — POCT URINE PREGNANCY: Preg Test, Ur: NEGATIVE

## 2023-07-24 NOTE — Progress Notes (Signed)
 New Patient Office Visit  Subjective:  Patient ID: Heather Travis, female    DOB: 06/11/1980  Age: 43 y.o. MRN: [id number]  CC:  Chief Complaint  Patient presents with   Establish Care    New pt est care. Pt thinks she may be pregnant due to sleeping a lot and itchy breast. She states she did get her period but still thinks she's pregnant    HPI Heather Travis presents to establish care.  Discussed the use of AI scribe software for clinical note transcription with the patient, who gave verbal consent to proceed.  History of Present Illness   Heather Travis is a 43 year old female who presents with nipple itching and concerns about infertility.  She has been experiencing persistent and severe itching and a rupture in her right nipple after receiving IV glutathione and vitamin C infusions for weight loss over the past two months. Initially, she received these infusions for eight months without issues, but symptoms began after changing the infusion type. The itching primarily affects the right nipple, with occasional mild symptoms on the left. She discontinued the infusions, suspecting an allergy, but symptoms persisted. She was prescribed a cream three days ago that significantly alleviated the itching, though it returns slightly with sweating. No discharge or significant redness from the nipple is noted, and she has never had a mammogram.  She has been trying to conceive with her partner for the past five months without success. She reports regular menstrual cycles every 28 days, though she experienced spotting a week before her period for four months, which resolved last month. She has never been pregnant and has not undergone any prior fertility evaluations. She is not using any form of birth control and engages in regular intercourse with her partner.  She is a [employer] architect, traveling out of state weekly, and has been living in [place] for three years. She is originally from [place] and has no  family remaining there.       Outpatient Encounter Medications as of 07/24/2023  Medication Sig   triamcinolone cream (KENALOG) 0.1 % Apply 1 Application topically 2 (two) times daily.   No facility-administered encounter medications on file as of 07/24/2023.    History reviewed. No pertinent past medical history.  History reviewed. No pertinent surgical history.  History reviewed. No pertinent family history.  Social History   Socioeconomic History   Marital status: Married    Spouse name: Not on file   Number of children: Not on file   Years of education: Not on file   Highest education level: Not on file  Occupational History   Not on file  Tobacco Use   Smoking status: Never   Smokeless tobacco: Never  Substance and Sexual Activity   Alcohol use: Not Currently   Drug use: Never   Sexual activity: Yes    Partners: Male  Other Topics Concern   Not on file  Social History Narrative   Not on file   Social Drivers of Health   Financial Resource Strain: Not on file  Food Insecurity: Not on file  Transportation Needs: Not on file  Physical Activity: Not on file  Stress: Not on file  Social Connections: Unknown (09/04/2021)   Received from [facility]   Social Network    Social Network: Not on file  Intimate Partner Violence: Unknown (08/01/2021)   Received from [facility]   HITS    Physically Hurt: Not on file    Insult  or Talk Down To: Not on file    Threaten Physical Harm: Not on file    Scream or Curse: Not on file    ROS: as noted in HPI  Objective:  BP 114/74   Pulse 84   Ht 5\' 5"  (1.651 m)   Wt 177 lb 12.8 oz (80.6 kg)   LMP 07/07/2023 (Approximate)   SpO2 97%   BMI 29.59 kg/m   Physical Exam Vitals and nursing note reviewed.  Constitutional:      General: She is not in acute distress.    Appearance: Normal appearance. She is not ill-appearing, toxic-appearing or diaphoretic.  HENT:     Head: Normocephalic and atraumatic.     Right Ear:  Tympanic membrane, ear canal and external ear normal. There is no impacted cerumen.     Left Ear: Tympanic membrane, ear canal and external ear normal. There is no impacted cerumen.     Nose: Nose normal.     Mouth/Throat:     Mouth: Mucous membranes are moist.     Pharynx: Oropharynx is clear. No oropharyngeal exudate or posterior oropharyngeal erythema.  Eyes:     General: No scleral icterus.       Right eye: No discharge.        Left eye: No discharge.     Extraocular Movements: Extraocular movements intact.     Pupils: Pupils are equal, round, and reactive to light.  Neck:     Thyroid: No thyroid mass, thyromegaly or thyroid tenderness.  Cardiovascular:     Rate and Rhythm: Normal rate and regular rhythm.     Pulses: Normal pulses.     Heart sounds: No murmur heard. Pulmonary:     Effort: Pulmonary effort is normal. No respiratory distress.     Breath sounds: Normal breath sounds. No stridor. No wheezing or rhonchi.  Chest:    Abdominal:     General: Abdomen is flat. Bowel sounds are normal. There is no distension.     Palpations: Abdomen is soft. There is no mass.     Tenderness: There is no abdominal tenderness. There is no guarding.  Musculoskeletal:     Cervical back: Normal range of motion and neck supple. No rigidity or tenderness.     Right lower leg: No edema.     Left lower leg: No edema.  Lymphadenopathy:     Cervical: No cervical adenopathy.  Skin:    General: Skin is warm and dry.     Coloration: Skin is not jaundiced.     Findings: No bruising, erythema or rash.  Neurological:     General: No focal deficit present.     Mental Status: She is alert and oriented to person, place, and time.     Sensory: No sensory deficit.     Motor: No weakness.  Psychiatric:        Mood and Affect: Mood normal.        Behavior: Behavior normal.      Assessment & Plan:  Nipple dermatitis  Pregnancy examination or test, negative result -     POCT urine  pregnancy  Irregular menses -     Estradiol -     Prolactin -     TSH -     Hemoglobin A1c -     Follicle stimulating hormone -     Luteinizing hormone -     Testosterone -     Comprehensive metabolic panel with GFR -  CBC with Differential/Platelet -     Anti mullerian hormone  Encounter for infertility -     Lipid panel -     Estradiol -     Prolactin -     TSH -     Hemoglobin A1c -     Follicle stimulating hormone -     Luteinizing hormone -     Testosterone -     Comprehensive metabolic panel with GFR -     CBC with Differential/Platelet -     Anti mullerian hormone -     DG Hysterogram (HSD); Future  Screening mammogram for breast cancer -     3D Screening Mammogram, Left and Right; Future  Assessment and Plan    Nipple Dermatitis Symptoms improved with topical cream, indicating dermatitis. High likelihood of dermatitis. - Continue topical cream for up to 14 days. - Order mammogram to rule out underlying breast pathology.  Infertility Attempting conception for five months with regular cycles and premenstrual spotting. Opted for early fertility evaluation. - Order initial infertility blood work. - Order hysterosalpingogram to assess fallopian tube patency.  General Health Maintenance She is 44 and has never had a mammogram. Reassured about safety and importance of mammograms. - Order mammogram and coordinate scheduling.        Return in about 3 weeks (around 08/14/2023).   Maretta Bees, PA

## 2023-07-24 NOTE — Patient Instructions (Addendum)
 We drew labs and ordered a sonogram to assess for infertility.  Please obtain a mammogram to ensure your current nipple concerns are not related to an underlying disorder.  You can continue to use the topical steroid cream for up to 14 days.  Please return in 2-3 weeks for recheck and follow up on labs.

## 2023-07-25 LAB — CBC WITH DIFFERENTIAL/PLATELET
Basophils Absolute: 0 10*3/uL (ref 0.0–0.1)
Basophils Relative: 0.6 % (ref 0.0–3.0)
Eosinophils Absolute: 0.1 10*3/uL (ref 0.0–0.7)
Eosinophils Relative: 1.4 % (ref 0.0–5.0)
HCT: 39.6 % (ref 36.0–46.0)
Hemoglobin: 13 g/dL (ref 12.0–15.0)
Lymphocytes Relative: 33.8 % (ref 12.0–46.0)
Lymphs Abs: 2.7 10*3/uL (ref 0.7–4.0)
MCHC: 33 g/dL (ref 30.0–36.0)
MCV: 92.9 fl (ref 78.0–100.0)
Monocytes Absolute: 0.6 10*3/uL (ref 0.1–1.0)
Monocytes Relative: 7.6 % (ref 3.0–12.0)
Neutro Abs: 4.5 10*3/uL (ref 1.4–7.7)
Neutrophils Relative %: 56.6 % (ref 43.0–77.0)
Platelets: 277 10*3/uL (ref 150.0–400.0)
RBC: 4.26 Mil/uL (ref 3.87–5.11)
RDW: 12.4 % (ref 11.5–15.5)
WBC: 8 10*3/uL (ref 4.0–10.5)

## 2023-07-25 LAB — LIPID PANEL
Cholesterol: 201 mg/dL — ABNORMAL HIGH (ref 0–200)
HDL: 52.9 mg/dL (ref >39.00)
LDL Cholesterol: 92 mg/dL (ref 0–99)
NonHDL: 148.06
Total CHOL/HDL Ratio: 4
Triglycerides: 278 mg/dL — ABNORMAL HIGH (ref 0.0–149.0)
VLDL: 55.6 mg/dL — ABNORMAL HIGH (ref 0.0–40.0)

## 2023-07-25 LAB — COMPREHENSIVE METABOLIC PANEL WITH GFR
ALT: 20 U/L (ref 0–35)
AST: 18 U/L (ref 0–37)
Albumin: 4.4 g/dL (ref 3.5–5.2)
Alkaline Phosphatase: 60 U/L (ref 39–117)
BUN: 18 mg/dL (ref 6–23)
CO2: 27 meq/L (ref 19–32)
Calcium: 9.2 mg/dL (ref 8.4–10.5)
Chloride: 103 meq/L (ref 96–112)
Creatinine, Ser: 0.5 mg/dL (ref 0.40–1.20)
GFR: 116.5 mL/min (ref >60.00)
Glucose, Bld: 118 mg/dL — ABNORMAL HIGH (ref 70–99)
Potassium: 3.6 meq/L (ref 3.5–5.1)
Sodium: 140 meq/L (ref 135–145)
Total Bilirubin: 0.3 mg/dL (ref 0.2–1.2)
Total Protein: 6.9 g/dL (ref 6.0–8.3)

## 2023-07-25 LAB — HEMOGLOBIN A1C: Hgb A1c MFr Bld: 5.7 % (ref 4.6–6.5)

## 2023-07-25 LAB — LUTEINIZING HORMONE: LH: 4.59 m[IU]/mL

## 2023-07-25 LAB — FOLLICLE STIMULATING HORMONE: FSH: 2.9 m[IU]/mL

## 2023-07-25 LAB — TESTOSTERONE: Testosterone: 62.92 ng/dL — ABNORMAL HIGH (ref 15.00–40.00)

## 2023-07-25 LAB — TSH: TSH: 1.07 u[IU]/mL (ref 0.35–5.50)

## 2023-07-27 LAB — PROLACTIN: Prolactin: 12.3 ng/mL

## 2023-07-27 LAB — ESTRADIOL: Estradiol: 89 pg/mL

## 2023-07-27 LAB — ANTI-MULLERIAN HORMONE (AMH), FEMALE: Anti-Mullerian Hormones(AMH), Female: 0.49 ng/mL (ref 0.01–2.99)

## 2023-07-28 ENCOUNTER — Other Ambulatory Visit: Payer: Self-pay | Admitting: Urgent Care

## 2023-07-28 ENCOUNTER — Encounter: Payer: Self-pay | Admitting: Urgent Care

## 2023-07-28 DIAGNOSIS — Z319 Encounter for procreative management, unspecified: Secondary | ICD-10-CM

## 2023-07-28 DIAGNOSIS — R7989 Other specified abnormal findings of blood chemistry: Secondary | ICD-10-CM

## 2023-08-20 ENCOUNTER — Ambulatory Visit

## 2023-09-11 ENCOUNTER — Ambulatory Visit (INDEPENDENT_AMBULATORY_CARE_PROVIDER_SITE_OTHER)

## 2023-09-11 DIAGNOSIS — Z1231 Encounter for screening mammogram for malignant neoplasm of breast: Secondary | ICD-10-CM

## 2023-09-18 ENCOUNTER — Other Ambulatory Visit: Payer: Self-pay | Admitting: Urgent Care

## 2023-09-18 ENCOUNTER — Ambulatory Visit: Payer: Self-pay | Admitting: Urgent Care

## 2023-09-18 DIAGNOSIS — R928 Other abnormal and inconclusive findings on diagnostic imaging of breast: Secondary | ICD-10-CM

## 2023-10-07 ENCOUNTER — Inpatient Hospital Stay: Admission: RE | Admit: 2023-10-07 | Source: Ambulatory Visit

## 2023-12-15 ENCOUNTER — Other Ambulatory Visit: Payer: Self-pay | Admitting: Urgent Care

## 2023-12-15 DIAGNOSIS — R928 Other abnormal and inconclusive findings on diagnostic imaging of breast: Secondary | ICD-10-CM

## 2023-12-15 NOTE — Progress Notes (Signed)
 Re-ordered additional imaging based upon abnormal mammogram. Will have staff reach out to patient to schedule.

## 2023-12-17 ENCOUNTER — Telehealth: Payer: Self-pay | Admitting: Urgent Care

## 2023-12-17 NOTE — Telephone Encounter (Signed)
 Okay then can we please mail out a letter? She needs to complete additional mammogram screening. I do have a letter on my desk, maybe we could mail her that. thanks

## 2023-12-17 NOTE — Telephone Encounter (Signed)
 I have placed two orders for diagnostic mammogram with ultrasound. Pt had abnormal breast imaging on 09/11/23, and has not followed up. Can we please call/ contact pt to notify her that we do need her to follow up and complete additional imaging.

## 2023-12-18 NOTE — Telephone Encounter (Signed)
 Letter also mailed to pt letting her know that we need to get in touch with her regarding her mammogram results. Annabella Rigg, CMA

## 2023-12-18 NOTE — Telephone Encounter (Signed)
 Letter given to you. A generic please contact our office regarding your mammogram letter would suffice. thanks
